# Patient Record
Sex: Male | Born: 1975 | Race: Black or African American | Hispanic: No | Marital: Single | Smoking: Current some day smoker
Health system: Southern US, Community
[De-identification: ages and names within clinical notes are randomized; demographics above are authoritative.]

## PROBLEM LIST (undated history)

## (undated) DIAGNOSIS — J302 Other seasonal allergic rhinitis: Secondary | ICD-10-CM

## (undated) DIAGNOSIS — S129XXA Fracture of neck, unspecified, initial encounter: Secondary | ICD-10-CM

## (undated) DIAGNOSIS — R011 Cardiac murmur, unspecified: Secondary | ICD-10-CM

---

## 2003-01-29 ENCOUNTER — Inpatient Hospital Stay (HOSPITAL_COMMUNITY): Admission: AC | Admit: 2003-01-29 | Discharge: 2003-02-01 | Payer: Self-pay

## 2003-02-18 ENCOUNTER — Encounter: Admission: RE | Admit: 2003-02-18 | Discharge: 2003-02-18 | Payer: Self-pay | Admitting: Neurosurgery

## 2003-03-25 ENCOUNTER — Encounter: Admission: RE | Admit: 2003-03-25 | Discharge: 2003-03-25 | Payer: Self-pay | Admitting: Neurosurgery
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
## 2012-03-02 ENCOUNTER — Emergency Department (HOSPITAL_COMMUNITY)
Admission: EM | Admit: 2012-03-02 | Discharge: 2012-03-02 | Disposition: A | Discharge status: Home / Self Care | Payer: No Typology Code available for payment source | Attending: Emergency Medicine | Admitting: Emergency Medicine

## 2012-03-02 ENCOUNTER — Encounter (HOSPITAL_COMMUNITY): Payer: Self-pay

## 2012-03-02 DIAGNOSIS — Z8679 Personal history of other diseases of the circulatory system: Secondary | ICD-10-CM | POA: Insufficient documentation

## 2012-03-02 DIAGNOSIS — Y929 Unspecified place or not applicable: Secondary | ICD-10-CM | POA: Insufficient documentation

## 2012-03-02 DIAGNOSIS — F17200 Nicotine dependence, unspecified, uncomplicated: Secondary | ICD-10-CM | POA: Insufficient documentation

## 2012-03-02 DIAGNOSIS — Y939 Activity, unspecified: Secondary | ICD-10-CM | POA: Insufficient documentation

## 2012-03-02 DIAGNOSIS — S139XXA Sprain of joints and ligaments of unspecified parts of neck, initial encounter: Secondary | ICD-10-CM | POA: Insufficient documentation

## 2012-03-02 DIAGNOSIS — S161XXA Strain of muscle, fascia and tendon at neck level, initial encounter: Secondary | ICD-10-CM

## 2012-03-02 DIAGNOSIS — M62838 Other muscle spasm: Secondary | ICD-10-CM | POA: Insufficient documentation

## 2012-03-02 DIAGNOSIS — Z8781 Personal history of (healed) traumatic fracture: Secondary | ICD-10-CM | POA: Insufficient documentation

## 2012-03-02 DIAGNOSIS — X58XXXA Exposure to other specified factors, initial encounter: Secondary | ICD-10-CM | POA: Insufficient documentation

## 2012-03-02 HISTORY — DX: Cardiac murmur, unspecified: R01.1

## 2012-03-02 HISTORY — DX: Other seasonal allergic rhinitis: J30.2

## 2012-03-02 MED ORDER — METHOCARBAMOL 500 MG PO TABS
500.0000 mg | ORAL_TABLET | Freq: Once | ORAL | Status: AC
  Administered 2012-03-02: 500 mg via ORAL
  Filled 2012-03-02: qty 1

## 2012-03-02 MED ORDER — HYDROCODONE-ACETAMINOPHEN 5-325 MG PO TABS
2.0000 | ORAL_TABLET | Freq: Once | ORAL | Status: AC
  Administered 2012-03-02: 2 via ORAL
  Filled 2012-03-02: qty 2

## 2012-03-02 MED ORDER — HYDROCODONE-ACETAMINOPHEN 5-325 MG PO TABS: 1.0000 | ORAL_TABLET | ORAL | Status: AC | PRN

## 2012-03-02 MED ORDER — IBUPROFEN 400 MG PO TABS
600.0000 mg | ORAL_TABLET | Freq: Once | ORAL | Status: AC
  Administered 2012-03-02: 600 mg via ORAL
  Filled 2012-03-02: qty 2

## 2012-03-02 MED ORDER — IBUPROFEN 600 MG PO TABS: 600.0000 mg | ORAL_TABLET | Freq: Four times a day (QID) | ORAL | Status: DC | PRN

## 2012-03-02 MED ORDER — METHOCARBAMOL 500 MG PO TABS: 500.0000 mg | ORAL_TABLET | Freq: Two times a day (BID) | ORAL | Status: AC
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
## 2012-03-02 NOTE — ED Notes (Signed)
The patient states that his neck has been hurting him since Wednesday.  He denies any trauma.

## 2014-02-06 ENCOUNTER — Emergency Department (HOSPITAL_COMMUNITY)
Admission: EM | Admit: 2014-02-06 | Discharge: 2014-02-06 | Disposition: A | Discharge status: Home / Self Care | Payer: No Typology Code available for payment source | Attending: Emergency Medicine | Admitting: Emergency Medicine

## 2014-02-06 ENCOUNTER — Encounter (HOSPITAL_COMMUNITY): Payer: Self-pay | Admitting: Emergency Medicine

## 2014-02-06 DIAGNOSIS — R0789 Other chest pain: Secondary | ICD-10-CM | POA: Diagnosis not present

## 2014-02-06 DIAGNOSIS — Z23 Encounter for immunization: Secondary | ICD-10-CM | POA: Insufficient documentation

## 2014-02-06 DIAGNOSIS — Z79899 Other long term (current) drug therapy: Secondary | ICD-10-CM | POA: Diagnosis not present

## 2014-02-06 DIAGNOSIS — M25521 Pain in right elbow: Secondary | ICD-10-CM | POA: Diagnosis not present

## 2014-02-06 DIAGNOSIS — Z720 Tobacco use: Secondary | ICD-10-CM | POA: Insufficient documentation

## 2014-02-06 DIAGNOSIS — R079 Chest pain, unspecified: Secondary | ICD-10-CM | POA: Diagnosis present

## 2014-02-06 DIAGNOSIS — Z8781 Personal history of (healed) traumatic fracture: Secondary | ICD-10-CM | POA: Diagnosis not present

## 2014-02-06 DIAGNOSIS — R011 Cardiac murmur, unspecified: Secondary | ICD-10-CM | POA: Insufficient documentation

## 2014-02-06 HISTORY — DX: Fracture of neck, unspecified, initial encounter: S12.9XXA

## 2014-02-06 LAB — I-STAT TROPONIN, ED: Troponin i, poc: 0 ng/mL (ref 0.00–0.08)

## 2014-02-06 LAB — I-STAT CHEM 8, ED
BUN: 6 mg/dL (ref 6–23)
CALCIUM ION: 1.21 mmol/L (ref 1.12–1.23)
CHLORIDE: 102 meq/L (ref 96–112)
Creatinine, Ser: 0.9 mg/dL (ref 0.50–1.35)
GLUCOSE: 92 mg/dL (ref 70–99)
HCT: 44 % (ref 39.0–52.0)
Hemoglobin: 15 g/dL (ref 13.0–17.0)
POTASSIUM: 3.9 mmol/L (ref 3.5–5.1)
SODIUM: 139 mmol/L (ref 135–145)
TCO2: 22 mmol/L (ref 0–100)

## 2014-02-06 MED ORDER — TETANUS-DIPHTH-ACELL PERTUSSIS 5-2.5-18.5 LF-MCG/0.5 IM SUSP
0.5000 mL | Freq: Once | INTRAMUSCULAR | Status: AC
  Administered 2014-02-06: 0.5 mL via INTRAMUSCULAR
  Filled 2014-02-06: qty 0.5

## 2014-02-06 MED ORDER — IBUPROFEN 800 MG PO TABS
800.0000 mg | ORAL_TABLET | Freq: Once | ORAL | Status: AC
  Administered 2014-02-06: 800 mg via ORAL
  Filled 2014-02-06: qty 1

## 2014-02-06 MED ORDER — IBUPROFEN 600 MG PO TABS: 600.0000 mg | ORAL_TABLET | Freq: Four times a day (QID) | ORAL | Status: DC | PRN

## 2014-02-06 MED ORDER — IBUPROFEN 800 MG PO TABS: 800.0000 mg | ORAL_TABLET | Freq: Three times a day (TID) | ORAL | Status: DC

## 2014-02-06 MED ORDER — CEPHALEXIN 500 MG PO CAPS: 500.0000 mg | ORAL_CAPSULE | Freq: Four times a day (QID) | ORAL | Status: AC

## 2014-02-06 MED ORDER — CEPHALEXIN 250 MG PO CAPS
500.0000 mg | ORAL_CAPSULE | Freq: Once | ORAL | Status: AC
  Administered 2014-02-06: 500 mg via ORAL
  Filled 2014-02-06: qty 2

## 2014-02-06 NOTE — Discharge Instructions (Signed)
Chest Pain (Nonspecific) Take the medication as prescribed. Call Dr. Magnus Ivan if your elbow is not improving by next week. Return if your condition worsens for any reason. Call any of the numbers on the resource guide to get a primary care physician. Ask your new primary care physician to help you to stop smoking It is often hard to give a specific diagnosis for the cause of chest pain. There is always a chance that your pain could be related to something serious, such as a heart attack or a blood clot in the lungs. You need to follow up with your health care provider for further evaluation. CAUSES   Heartburn.  Pneumonia or bronchitis.  Anxiety or stress.  Inflammation around your heart (pericarditis) or lung (pleuritis or pleurisy).  A blood clot in the lung.  A collapsed lung (pneumothorax). It can develop suddenly on its own (spontaneous pneumothorax) or from trauma to the chest.  Shingles infection (herpes zoster virus). The chest wall is composed of bones, muscles, and cartilage. Any of these can be the source of the pain.  The bones can be bruised by injury.  The muscles or cartilage can be strained by coughing or overwork.  The cartilage can be affected by inflammation and become sore (costochondritis). DIAGNOSIS  Lab tests or other studies may be needed to find the cause of your pain. Your health care provider may have you take a test called an ambulatory electrocardiogram (ECG). An ECG records your heartbeat patterns over a 24-hour period. You may also have other tests, such as:  Transthoracic echocardiogram (TTE). During echocardiography, sound waves are used to evaluate how blood flows through your heart.  Transesophageal echocardiogram (TEE).  Cardiac monitoring. This allows your health care provider to monitor your heart rate and rhythm in real time.  Holter monitor. This is a portable device that records your heartbeat and can help diagnose heart arrhythmias. It  allows your health care provider to track your heart activity for several days, if needed.  Stress tests by exercise or by giving medicine that makes the heart beat faster. TREATMENT   Treatment depends on what may be causing your chest pain. Treatment may include:  Acid blockers for heartburn.  Anti-inflammatory medicine.  Pain medicine for inflammatory conditions.  Antibiotics if an infection is present.  You may be advised to change lifestyle habits. This includes stopping smoking and avoiding alcohol, caffeine, and chocolate.  You may be advised to keep your head raised (elevated) when sleeping. This reduces the chance of acid going backward from your stomach into your esophagus. Most of the time, nonspecific chest pain will improve within 2-3 days with rest and mild pain medicine.  HOME CARE INSTRUCTIONS   If antibiotics were prescribed, take them as directed. Finish them even if you start to feel better.  For the next few days, avoid physical activities that bring on chest pain. Continue physical activities as directed.  Do not use any tobacco products, including cigarettes, chewing tobacco, or electronic cigarettes.  Avoid drinking alcohol.  Only take medicine as directed by your health care provider.  Follow your health care provider's suggestions for further testing if your chest pain does not go away.  Keep any follow-up appointments you made. If you do not go to an appointment, you could develop lasting (chronic) problems with pain. If there is any problem keeping an appointment, call to reschedule. SEEK MEDICAL CARE IF:   Your chest pain does not go away, even after treatment.  You  have a rash with blisters on your chest.  You have a fever. SEEK IMMEDIATE MEDICAL CARE IF:   You have increased chest pain or pain that spreads to your arm, neck, jaw, back, or abdomen.  You have shortness of breath.  You have an increasing cough, or you cough up blood.  You  have severe back or abdominal pain.  You feel nauseous or vomit.  You have severe weakness.  You faint.  You have chills. This is an emergency. Do not wait to see if the pain will go away. Get medical help at once. Call your local emergency services (911 in U.S.). Do not drive yourself to the hospital. MAKE SURE YOU:   Understand these instructions.  Will watch your condition.  Will get help right away if you are not doing well or get worse. Document Released: 10/19/2004 Document Revised: 01/14/2013 Document Reviewed: 08/15/2007 [facility] Patient Information 2015 [place], [place]. This information is not intended to replace advice given to you by your health care provider. Make sure you discuss any questions you have with your health care provider. Bursitis Bursitis is when the fluid-filled sac (bursa) that covers and protects a joint gets puffy and irritated. The elbow, shoulder, hip, and knee joints are most often affected. HOME CARE  Put ice on the area.  Put ice in a plastic bag.  Place a towel between your skin and the bag.  Leave the ice on for 15-20 minutes, 03-04 times a day.  Put the joint through a full range of motion 4 times a day. Rest the injured joint at other times. When you have less pain, begin slow movements and usual activities.  Only take medicine as told by your doctor.  Follow up with your doctor. Any delay in care could stop the bursitis from healing. This could cause long-term pain. GET HELP RIGHT AWAY IF:   You have more pain with treatment.  You have a temperature by mouth above 102 F (38.9 C), not controlled by medicine.  You have heat and irritation over the fluid-filled sac. MAKE SURE YOU:   Understand these instructions.  Will watch your condition.  Will get help right away if you are not doing well or get worse. Document Released: 06/29/2009 Document Revised: 04/03/2011 Document Reviewed: 03/31/2013 [facility] Patient Information 2015  [place], [place]. This information is not intended to replace advice given to you by your health care provider. Make sure you discuss any questions you have with your health care provider.  Emergency Department Resource Guide 1) Find a Doctor and Pay Out of Pocket Although you won't have to find out who is covered by your insurance plan, it is a good idea to ask around and get recommendations. You will then need to call the office and see if the doctor you have chosen will accept you as a new patient and what types of options they offer for patients who are self-pay. Some doctors offer discounts or will set up payment plans for their patients who do not have insurance, but you will need to ask so you aren't surprised when you get to your appointment.  2) Contact Your Local Health Department Not all health departments have doctors that can see patients for sick visits, but many do, so it is worth a call to see if yours does. If you don't know where your local health department is, you can check in your phone book. The CDC also has a tool to help you locate your state's health  department, and many state websites also have listings of all of their local health departments.  3) Find a Walk-in Clinic If your illness is not likely to be very severe or complicated, you may want to try a walk in clinic. These are popping up all over the country in pharmacies, drugstores, and shopping centers. They're usually staffed by nurse practitioners or physician assistants that have been trained to treat common illnesses and complaints. They're usually fairly quick and inexpensive. However, if you have serious medical issues or chronic medical problems, these are probably not your best option.  No Primary Care Doctor: - Call Health Connect at  [phone] - they can help you locate a primary care doctor that  accepts your insurance, provides certain services, etc. - Physician Referral Service- [phone]  Chronic Pain  Problems: Organization         Address  Phone   Notes  [facility]  [phone] Patients need to be referred by their primary care doctor.   Medication Assistance: Organization         Address  Phone   Notes  [facility] [address] [place] [address] [place], [place] [address] [phone] --Must be a resident of [facility] -- Must have NO insurance coverage whatsoever (no Medicaid/ Medicare, etc.) -- The pt. MUST have a primary care doctor that directs their care regularly and follows them in the community   [facility]ist  [phone]   [employer]  [phone]    Agencies that provide inexpensive medical care: Organization         Address  Phone   Notes  [facility]  [phone]   [facility]    [phone]   [facility] [address] [place], [place] [address] [phone]   [facility] [address] [place], [place] [phone]   Planned Parenthood    [phone]   [facility]    [phone]   [facility]  [address], [place] Phone:  [phone], Fax:  [phone] Hours of Operation:  9 am - 6 pm, M-F.  Also accepts Medicaid/Medicare and self-pay.  [facility]  [address], [place] Phone: [phone], Fax: [phone]. Hours of Operation:  8:30 am - 5:30 pm, M-F.  Also accepts Medicaid and self-pay.  [facility] [place] [address], [place] Phone: [phone]   [facility] [address] Natasha Bence [place], [place] [phone], Ext. [phone] Mondays & Thursdays: 7-9 AM.  First 15 patients are seen on a first come, first serve basis.    Medicaid-accepting [facility] Providers:  Organization         Address  Phone   Notes  [facility] [address], [place] [phone] Also  accepts self-pay patients.  [facility] [address] Laurell Josephs [place], [place]  [phone]   [facility] 2 [address], [place] [phone]   [facility] [address] [place] [phone]   Renaye Rakers [address], [place]   [phone] Only accepts [place] Access [place] patients after they have their name applied to their card.   Self-Pay (no insurance) in [facility]:  Organization  Address  Phone   Notes  Sickle Cell Patients, [facility] [place] [phone]   [facility] [place] [phone]   [facility] [place]  [place], [address], [place] [phone]   [facility]/Dr. Osei-Bonsu  [address], [place] or [address], [place] [phone] Phone number for both [place] and [place] locations is the same.  [facility] [address] [place] [phone]   [facility] [address] [place] or [address] [phone]   [facility] [address], [place] [phone], phone; [phone], fax Sees patients 1st and 3rd Saturday of every month.  Must not qualify for public or private insurance (i.e. Medicaid, Medicare, [employer] Health Choice, Veterans' Benefits)  Household income should be no more than 200% of the poverty level The clinic cannot treat you if you are pregnant or think you are pregnant  Sexually transmitted diseases are not treated at the clinic.    Dental Care: Organization         Address  Phone  Notes  [facility] of [facility] [place] [phone] Accepts children up to age 23 who are enrolled in [place] or [place]; pregnant  women with a Medicaid card; and children who have applied for Medicaid or [place] Health Choice, but were declined, whose parents can pay a reduced fee at time of service.  [facility] Department of [facility]  [address], [place] [phone] Accepts children up to age 45 who are enrolled in [place] or [place]; pregnant women with a Medicaid card; and children who have applied for Medicaid or [place] Health Choice, but were declined, whose parents can pay a reduced fee at time of service.  [facility]  [place] [phone] Patients are seen by appointment only. Walk-ins are not accepted. [facility] will see patients 12 years of age and older. Monday - Tuesday (8am-5pm) Most Wednesdays (8:30-5pm) $30 per visit, cash only  [facility]  [address], [facility] [phone] Patients are seen by appointment only. Walk-ins are not accepted. [place] will see patients 69 years of age and older. One Wednesday Evening (Monthly: Volunteer Based).  $30 per visit, cash only  [place]  [phone] for adults; Children under age 31, call Graduate Pediatric Dentistry at [phone]. Children aged 22-14, please call [phone] to request a pediatric application.  Dental services are provided in all areas of dental care including fillings, crowns and bridges, complete and partial dentures, implants, gum treatment, root canals, and extractions. Preventive care is also provided. Treatment is provided to both adults and children. Patients are selected via a lottery and there is often a waiting list.   [facility] [address], [place]  [phone] [email or web address]   [facility] [address] [place], [place] [phone], Ext. [phone] Second and Fourth Thursday of each month, opens at 6:30 AM; Clinic ends at 9 AM.  Patients are  seen on a first-come first-served basis, and a limited number are seen during each clinic.   [facility]  [address] [place], [place]  [place], [place] [phone]   Eligibility Requirements You must have lived in [place], [place], or [place] counties for at least the last three months.   You cannot be eligible for state or federal sponsored [employer], including [employer], [place], or [employer].   You generally cannot be eligible for healthcare insurance through your employer.    How to apply: Eligibility screenings are held every Tuesday and Wednesday afternoon from 1:00 pm until 4:00 pm. You do not need an appointment for the interview!  [facility] [address] [place], [place]   [place]  [place] Department  [place]  [phone]    Behavioral Health Resources in the Community: Intensive Outpatient Programs Organization         Address  Phone  Notes  [facility] [place]. [address] [employer], [place] [phone]   [facility] Outpatient [address], [place], [place]   ADS: Alcohol [facility] Drug Svcs [address], [place], [place]   [place] [address]  [place], [place] or [phone]   Substance Abuse Resources Organization         Address  Phone  Notes  Alcohol and Drug Services  [phone]   [place]  [phone]   [facility] [place]   Chinita Pester  [phone]   Residential & Outpatient Substance Abuse Program  [phone]   Psychological Services Organization         Address  Phone  Notes  [facility]  [place]  [phone]   [place] [address], [place] or [phone]    Mobile Crisis  Teams Organization         Address  Phone  Notes  [facility], Mobile Crisis Care Unit  [phone]   [facility]  [address] [place], [place]   [place] Levels [address] [place] [phone]    Self-Help/Support Groups Organization         Address  Phone             Notes  [place]. of [place] - variety of support groups  [place] Call for more information  Narcotics Anonymous (NA), Caring [facility] [address], [employer] [place]  2 meetings at this location   Special educational needs teacher         Address  Phone  Notes  [facility] Residential Treatment [place],    [place]  [phone]   [facility]  [address] [place] [phone], [place], [place]   [place]ia Johnson, [place] [phone] Admissions: 8am-3pm M-F  Incentives Substance [place] [address]    [place], [place] [phone]   [facility] [address] [place] [address] Jadene Pierini [place], [place]   [facility] [address],  [place], [place]   [facility] - Intensive Outpatient [address] Kristeen Mans [address], [place], [place]   [facility] ([place].) [place].,  [place], [place] or [phone]   [facility] (RTS) [address], [place], [place] Accepts Medicaid  [facility] [address]  [place] [phone] Substance Abuse/Addiction Treatment   [facility] Resources Organization  Address  Phone  Notes  [facility]  [phone](888) [phone]   Angie FavaJulie Brannon, PhD [address], [address] [place], [place]   [phone]) [phone] or [phone]) [phone]   [facility]   [address] [place], [place] [phone](336) [phone]   [facility] [address], [place], [place] [phone]) [phone]  Insurance/Medicaid/sponsorship through [facility]  [facility] Families [address]                                    [place], [place] [phone]) [phone] Therapy/tele-psych/case  [facility] [address].   [place], [place] [phone]) [phone]    Dr. Lolly MustacheArfeen  [phone]) [phone]   [facility] [place]  [employer] [facility] 1) [address], [place] 2) [address], [place] 3)  [address], [place] [phone]) [phone]) [phone]  [phone]) [phone]   [facility] Child Abuse Hotline [phone](336) [phone] or [phone](336) [phone] (After Hours)

## 2014-02-06 NOTE — ED Notes (Signed)
Pt called in main ED waiting area with no response 

## 2014-02-06 NOTE — ED Notes (Signed)
Pt reports while at work last night began to experience, dizziness and chest pain. Pt had BP taken at work at it was elevated. Pt also reports had an abscess under right arm a week ago that opened up on his own. Pt also has a lump to right elbow area.

## 2014-02-06 NOTE — ED Provider Notes (Addendum)
CSN: [id number]     Arrival date & time 02/06/14  1026 History   First MD Initiated Contact with Patient 02/06/14 1558     Chief Complaint  Patient presents with  . Hypertension  . Dizziness  . Chest Pain  . Abscess     (Consider location/radiation/quality/duration/timing/severity/associated sxs/prior Treatment) HPI Complains of chest pain left-sided anterior radiating to left scapula onset 4 days ago pain is intermittent and lasts 1 minute a time brought on by stress. Pain is nonexertional he is presently chest pain-free. He reports 5 minutes of shortness of breath last night. He has blood pressure taken at work today which was 188/135 and sent here for further evaluation. He denies any dizziness Patient also reports he had a "boil" at his right axilla earlier this week which "popped" and spontaneously drained. An presently complains of swelling and painful at right posterior elbow which is mild. He does report that he leans a lot on his elbows while at work. No injury. Nothing makes pain better or worse. No fever. No other associated symptoms. Past Medical History  Diagnosis Date  . Heart murmur   . Seasonal allergic rhinitis   . Neck fracture    History reviewed. No pertinent past surgical history. Family History  Problem Relation Age of Onset  . Hypertension Mother   . Liver disease Father   . Pancreatic cancer Father    History  Substance Use Topics  . Smoking status: Current Some Day Smoker -- 1.50 packs/day    Types: Cigars  . Smokeless tobacco: Never Used  . Alcohol Use: 0.5 oz/week    1 Not specified per week    Review of Systems  Constitutional: Negative.   HENT: Negative.   Respiratory: Positive for shortness of breath.   Cardiovascular: Positive for chest pain.  Gastrointestinal: Negative.   Musculoskeletal: Positive for arthralgias.       Right elbow pain  Skin: Negative.   Neurological: Negative.   Psychiatric/Behavioral: Negative.   All other systems  reviewed and are negative.     Allergies  Review of patient's allergies indicates no known allergies.  Home Medications   Prior to Admission medications   Medication Sig Start Date End Date Taking? Authorizing Provider  acetaminophen (TYLENOL) 500 MG tablet Take 500-1,000 mg by mouth every 6 (six) hours as needed for pain.   Yes Historical Provider, MD  cetirizine (ZYRTEC) 10 MG tablet Take 10 mg by mouth daily.   Yes Historical Provider, MD  ibuprofen (ADVIL,MOTRIN) 600 MG tablet Take 1 tablet (600 mg total) by mouth every 6 (six) hours as needed for pain. 03/02/12  Yes Derwood Kaplan, MD  HYDROcodone-acetaminophen (NORCO/VICODIN) 5-325 MG per tablet Take 1 tablet by mouth every 4 (four) hours as needed for pain. Patient not taking: Reported on 02/06/2014 03/02/12   Derwood Kaplan, MD  methocarbamol (ROBAXIN) 500 MG tablet Take 1 tablet (500 mg total) by mouth 2 (two) times daily. Patient not taking: Reported on 02/06/2014 03/02/12   Derwood Kaplan, MD   BP 112/83 mmHg  Pulse 69  Temp(Src) 98 F (36.7 C) (Oral)  Resp 14  Ht  (1.778 m)  Wt 175 lb (79.379 kg)  BMI 25.11 kg/m2  SpO2 100% Physical Exam  Constitutional: He appears well-developed and well-nourished.  HENT:  Head: Normocephalic and atraumatic.  Eyes: Conjunctivae are normal. Pupils are equal, round, and reactive to light.  Neck: Neck supple. No tracheal deviation present. No thyromegaly present.  Cardiovascular: Normal rate and regular rhythm.  No murmur heard. Pulmonary/Chest: Effort normal and breath sounds normal.  Abdominal: Soft. Bowel sounds are normal. He exhibits no distension. There is no tenderness.  Musculoskeletal: Normal range of motion. He exhibits no edema or tenderness.  Right upper extremity 1 cm open wound at axilla no drainage no fluctuance no swelling nontender. Elbow posteriorly mildly tender and swollen minimally reddened. No drainage. No fluctuance. Full range of motion without pain. Radial  pulse 2+  Neurological: He is alert. Coordination normal.  Skin: Skin is warm and dry. No rash noted.  Psychiatric: He has a normal mood and affect.  Nursing note and vitals reviewed.   ED Course  Procedures (including critical care time) Labs Review Labs Reviewed  CBC  BASIC METABOLIC PANEL  I-STAT TROPOININ, ED    Imaging Review No results found.   EKG Interpretation   Date/Time:  Friday February 06 2014 10:53:03 EST Ventricular Rate:  90 PR Interval:  174 QRS Duration: 82 QT Interval:  340 QTC Calculation: 415 R Axis:   75 Text Interpretation:  Normal sinus rhythm Normal ECG No old tracing to  compare Confirmed by Ethelda ChickJACUBOWITZ  MD, Melora Menon [phone]([id number]) on 02/06/2014 3:59:48 PM     Cardiac risk factors smoker otherwise negative  6 PM pain elbow pain has resolved after treatment with ibuprofen and Keflex Results for orders placed or performed during the hospital encounter of 02/06/14  I-stat troponin, ED (not at [facility])  Result Value Ref Range   Troponin i, poc 0.00 0.00 - 0.08 ng/mL   Comment 3          I-Stat Chem 8, ED  Result Value Ref Range   Sodium 139 135 - 145 mmol/L   Potassium 3.9 3.5 - 5.1 mmol/L   Chloride 102 96 - 112 mEq/L   BUN 6 6 - 23 mg/dL   Creatinine, Ser 6.040.90 0.50 - 1.35 mg/dL   Glucose, Bld 92 70 - 99 mg/dL   Calcium, Ion 5.401.21 9.811.12 - 1.23 mmol/L   TCO2 22 0 - 100 mmol/L   Hemoglobin 15.0 13.0 - 17.0 g/dL   HCT 19.144.0 47.839.0 - 29.552.0 %   No results found.  MDM  Heart score equals 1. Spent 5 minutes calcium patient on smoking cessation Chest pain highly atypical for acute coronary syndrome Clinically patient has olecranon bursitis. No definite infection though mildly reddened He likely had abscess at right axilla which spontaneously drained. An prescriptions ibuprofen, Keflex him a recheck referral resource guide for primary care physician. Orthopedic referral with Dr. Magnus IvanBlackman if elbow not better by next week Final diagnoses:  None   diagnosis #1  atypical chest pain #2 olecranon bursitis right elbow #3 tobacco abuse     Doug SouSam Sota Hetz, MD 02/06/14 1807  Doug SouSam Keiandra Sullenger, MD 02/06/14 1807

## 2016-03-03 ENCOUNTER — Encounter (HOSPITAL_COMMUNITY): Payer: Self-pay | Admitting: Emergency Medicine

## 2016-03-03 ENCOUNTER — Emergency Department (HOSPITAL_COMMUNITY)
Admission: EM | Admit: 2016-03-03 | Discharge: 2016-03-03 | Disposition: A | Discharge status: Home / Self Care | Payer: BLUE CROSS/BLUE SHIELD | Attending: Emergency Medicine | Admitting: Emergency Medicine

## 2016-03-03 ENCOUNTER — Emergency Department (HOSPITAL_COMMUNITY): Payer: BLUE CROSS/BLUE SHIELD

## 2016-03-03 DIAGNOSIS — Z79899 Other long term (current) drug therapy: Secondary | ICD-10-CM | POA: Diagnosis not present

## 2016-03-03 DIAGNOSIS — R0789 Other chest pain: Secondary | ICD-10-CM | POA: Diagnosis not present

## 2016-03-03 DIAGNOSIS — F17290 Nicotine dependence, other tobacco product, uncomplicated: Secondary | ICD-10-CM | POA: Diagnosis not present

## 2016-03-03 DIAGNOSIS — R079 Chest pain, unspecified: Secondary | ICD-10-CM | POA: Diagnosis present

## 2016-03-03 LAB — CBC
HCT: 37.1 % — ABNORMAL LOW (ref 39.0–52.0)
Hemoglobin: 12.2 g/dL — ABNORMAL LOW (ref 13.0–17.0)
MCH: 23.2 pg — AB (ref 26.0–34.0)
MCHC: 32.9 g/dL (ref 30.0–36.0)
MCV: 70.7 fL — AB (ref 78.0–100.0)
PLATELETS: 289 10*3/uL (ref 150–400)
RBC: 5.25 MIL/uL (ref 4.22–5.81)
RDW: 14.3 % (ref 11.5–15.5)
WBC: 8.3 10*3/uL (ref 4.0–10.5)

## 2016-03-03 LAB — BASIC METABOLIC PANEL
Anion gap: 8 (ref 5–15)
BUN: 11 mg/dL (ref 6–20)
CO2: 24 mmol/L (ref 22–32)
CREATININE: 1 mg/dL (ref 0.61–1.24)
Calcium: 10.1 mg/dL (ref 8.9–10.3)
Chloride: 105 mmol/L (ref 101–111)
GFR calc Af Amer: >60 mL/min (ref >60)
GFR calc non Af Amer: >60 mL/min (ref >60)
Glucose, Bld: 102 mg/dL — ABNORMAL HIGH (ref 65–99)
Potassium: 4.3 mmol/L (ref 3.5–5.1)
Sodium: 137 mmol/L (ref 135–145)

## 2016-03-03 LAB — I-STAT TROPONIN, ED: Troponin i, poc: 0 ng/mL (ref 0.00–0.08)

## 2016-03-03 NOTE — ED Provider Notes (Signed)
[facility]-EMERGENCY DEPT Provider Note   CSN: [id number] Arrival date & time: 03/03/16  0203  By signing my name below, I, Jack Johnson, attest that this documentation has been prepared under the direction and in the presence of Jack Lyons, MD.  Electronically Signed: Octavia Johnson, ED Scribe. 03/03/16. 3:45 AM.    History   Chief Complaint Chief Complaint  Patient presents with  . Chest Pain   The history is provided by the patient. No language interpreter was used.  Chest Pain   This is a new problem. The current episode started 3 to 5 hours ago. The problem occurs rarely. The problem has not changed since onset.The pain is present in the lateral region. The pain is moderate. The quality of the pain is described as sharp. Associated symptoms include shortness of breath. Pertinent negatives include no back pain, no cough, no fever, no headaches, no leg pain, no lower extremity edema, no numbness, no syncope and no weakness. He has tried nothing for the symptoms. The treatment provided no relief. There are no known risk factors.  Pertinent negatives for past medical history include no aneurysm, no CAD, no COPD, no CHF, no DVT, no hyperhomocysteinemia, no hypertension and no MI.   HPI Comments: Jack Johnson is a 41 y.o. male who presents to the Emergency Department complaining of intermittent, gradual worsening, left sided chest pain that radiates to his LUQ that began this evening. Pt describes his pain as a persistent, sharp sensation. He reports associated shortness of breath. He reports he was laying down after dinner this evening when began to feel the sharp pain in his chest that progressively became worse and he "started to shake". No medication has been taken to alleviate his pain. There has been no injury or trauma to the area. He has not had similar pain in the past. Pt denies fever, cough, leg swelling, or leg pain.  Past Medical History:  Diagnosis Date  . Heart murmur   . Neck  fracture ([facility])   . Seasonal allergic rhinitis     There are no active problems to display for this patient.   History reviewed. No pertinent surgical history.     Home Medications    Prior to Admission medications   Medication Sig Start Date End Date Taking? Authorizing Provider  acetaminophen (TYLENOL) 500 MG tablet Take 500-1,000 mg by mouth every 6 (six) hours as needed for pain.    Historical Provider, MD  cephALEXin (KEFLEX) 500 MG capsule Take 1 capsule (500 mg total) by mouth 4 (four) times daily. 02/06/14   Doug Sou, MD  cetirizine (ZYRTEC) 10 MG tablet Take 10 mg by mouth daily.    Historical Provider, MD  HYDROcodone-acetaminophen (NORCO/VICODIN) 5-325 MG per tablet Take 1 tablet by mouth every 4 (four) hours as needed for pain. Patient not taking: Reported on 02/06/2014 03/02/12   Derwood Kaplan, MD  ibuprofen (ADVIL,MOTRIN) 600 MG tablet Take 1 tablet (600 mg total) by mouth every 6 (six) hours as needed. 02/06/14   Doug Sou, MD  ibuprofen (ADVIL,MOTRIN) 800 MG tablet Take 1 tablet (800 mg total) by mouth 3 (three) times daily. 02/06/14   Doug Sou, MD  methocarbamol (ROBAXIN) 500 MG tablet Take 1 tablet (500 mg total) by mouth 2 (two) times daily. Patient not taking: Reported on 02/06/2014 03/02/12   Derwood Kaplan, MD    Family History Family History  Problem Relation Age of Onset  . Hypertension Mother   . Liver disease Father   .  Pancreatic cancer Father     Social History Social History  Substance Use Topics  . Smoking status: Current Some Day Smoker    Packs/day: 1.50    Types: Cigars  . Smokeless tobacco: Never Used  . Alcohol use 0.5 oz/week    1 Standard drinks or equivalent per week     Allergies   Patient has no known allergies.   Review of Systems Review of Systems  Constitutional: Negative for fever.  Respiratory: Positive for shortness of breath. Negative for cough.   Cardiovascular: Positive for chest pain. Negative for  syncope.  Musculoskeletal: Negative for back pain.  Neurological: Negative for weakness, numbness and headaches.  All other systems reviewed and are negative.    Physical Exam Updated Vital Signs BP 99/72   Pulse 71   Temp 98.3 F (36.8 C) (Oral)   Resp 17   SpO2 97%   Physical Exam  Constitutional: He is oriented to person, place, and time. He appears well-developed and well-nourished.  HENT:  Head: Normocephalic and atraumatic.  Eyes: EOM are normal.  Neck: Normal range of motion.  Cardiovascular: Normal rate, regular rhythm, normal heart sounds and intact distal pulses.   Pulmonary/Chest: Effort normal and breath sounds normal. No respiratory distress.  Abdominal: Soft. He exhibits no distension. There is no tenderness.  Musculoskeletal: Normal range of motion.  Neurological: He is alert and oriented to person, place, and time.  Skin: Skin is warm and dry.  Psychiatric: He has a normal mood and affect. Judgment normal.  Nursing note and vitals reviewed.    ED Treatments / Results  DIAGNOSTIC STUDIES: Oxygen Saturation is 97% on RA, normal by my interpretation.  COORDINATION OF CARE:  3:43 AM Discussed treatment plan with pt at bedside and pt agreed to plan.  Labs (all labs ordered are listed, but only abnormal results are displayed) Labs Reviewed  BASIC METABOLIC PANEL - Abnormal; Notable for the following:       Result Value   Glucose, Bld 102 (*)    All other components within normal limits  CBC - Abnormal; Notable for the following:    Hemoglobin 12.2 (*)    HCT 37.1 (*)    MCV 70.7 (*)    MCH 23.2 (*)    All other components within normal limits  I-STAT TROPOININ, ED    EKG  EKG Interpretation None       Radiology Dg Chest 2 View  Result Date: 03/03/2016 CLINICAL DATA:  Chest pain with tightening in the jaw and chest. EXAM: CHEST  2 VIEW COMPARISON:  None. FINDINGS: The heart size and mediastinal contours are within normal limits. Both lungs are  clear. The visualized skeletal structures are unremarkable. IMPRESSION: No active cardiopulmonary disease. Electronically Signed   By: Burman NievesWilliam  Stevens M.D.   On: 03/03/2016 02:34    Procedures Procedures (including critical care time)  Medications Ordered in ED Medications - No data to display   Initial Impression / Assessment and Plan / ED Course  I have reviewed the triage vital signs and the nursing notes.  Pertinent labs & imaging results that were available during my care of the patient were reviewed by me and considered in my medical decision making (see chart for details).     Patient is a 41 year old male who presents with complaints of pain in the left chest wall. He has no cardiac risk factors and his symptoms are very atypical for cardiac disease. Workup reveals no evidence for ischemia on EKG  or laboratory studies. I believe he is appropriate for discharge. To return as needed for any problems.  Final Clinical Impressions(s) / ED Diagnoses   Final diagnoses:  None   I personally performed the services described in this documentation, which was scribed in my presence. The recorded information has been reviewed and is accurate.      New Prescriptions New Prescriptions   No medications on file     Jack Lyons, MD 03/03/16 [phone]

## 2016-03-03 NOTE — ED Triage Notes (Signed)
Patient reports left chest pain with mild SOB onset yesterday , denies nausea or diaphoresis .

## 2016-03-03 NOTE — ED Notes (Signed)
ED Provider at bedside. 

## 2016-03-03 NOTE — Discharge Instructions (Signed)
Ibuprofen 600 mg every 6 hours as needed for pain.  Return to the emergency department if you develop worsening pain, high fever, productive cough, difficulty breathing, or other new and concerning symptoms.

## 2018-01-03 IMAGING — DX DG CHEST 2V
2 series · 2 of 2 positions shown · non-contrast
Comparison: None.

CLINICAL DATA: Chest pain with tightening in the jaw and chest.

EXAM:
CHEST  2 VIEW

[chest pa]
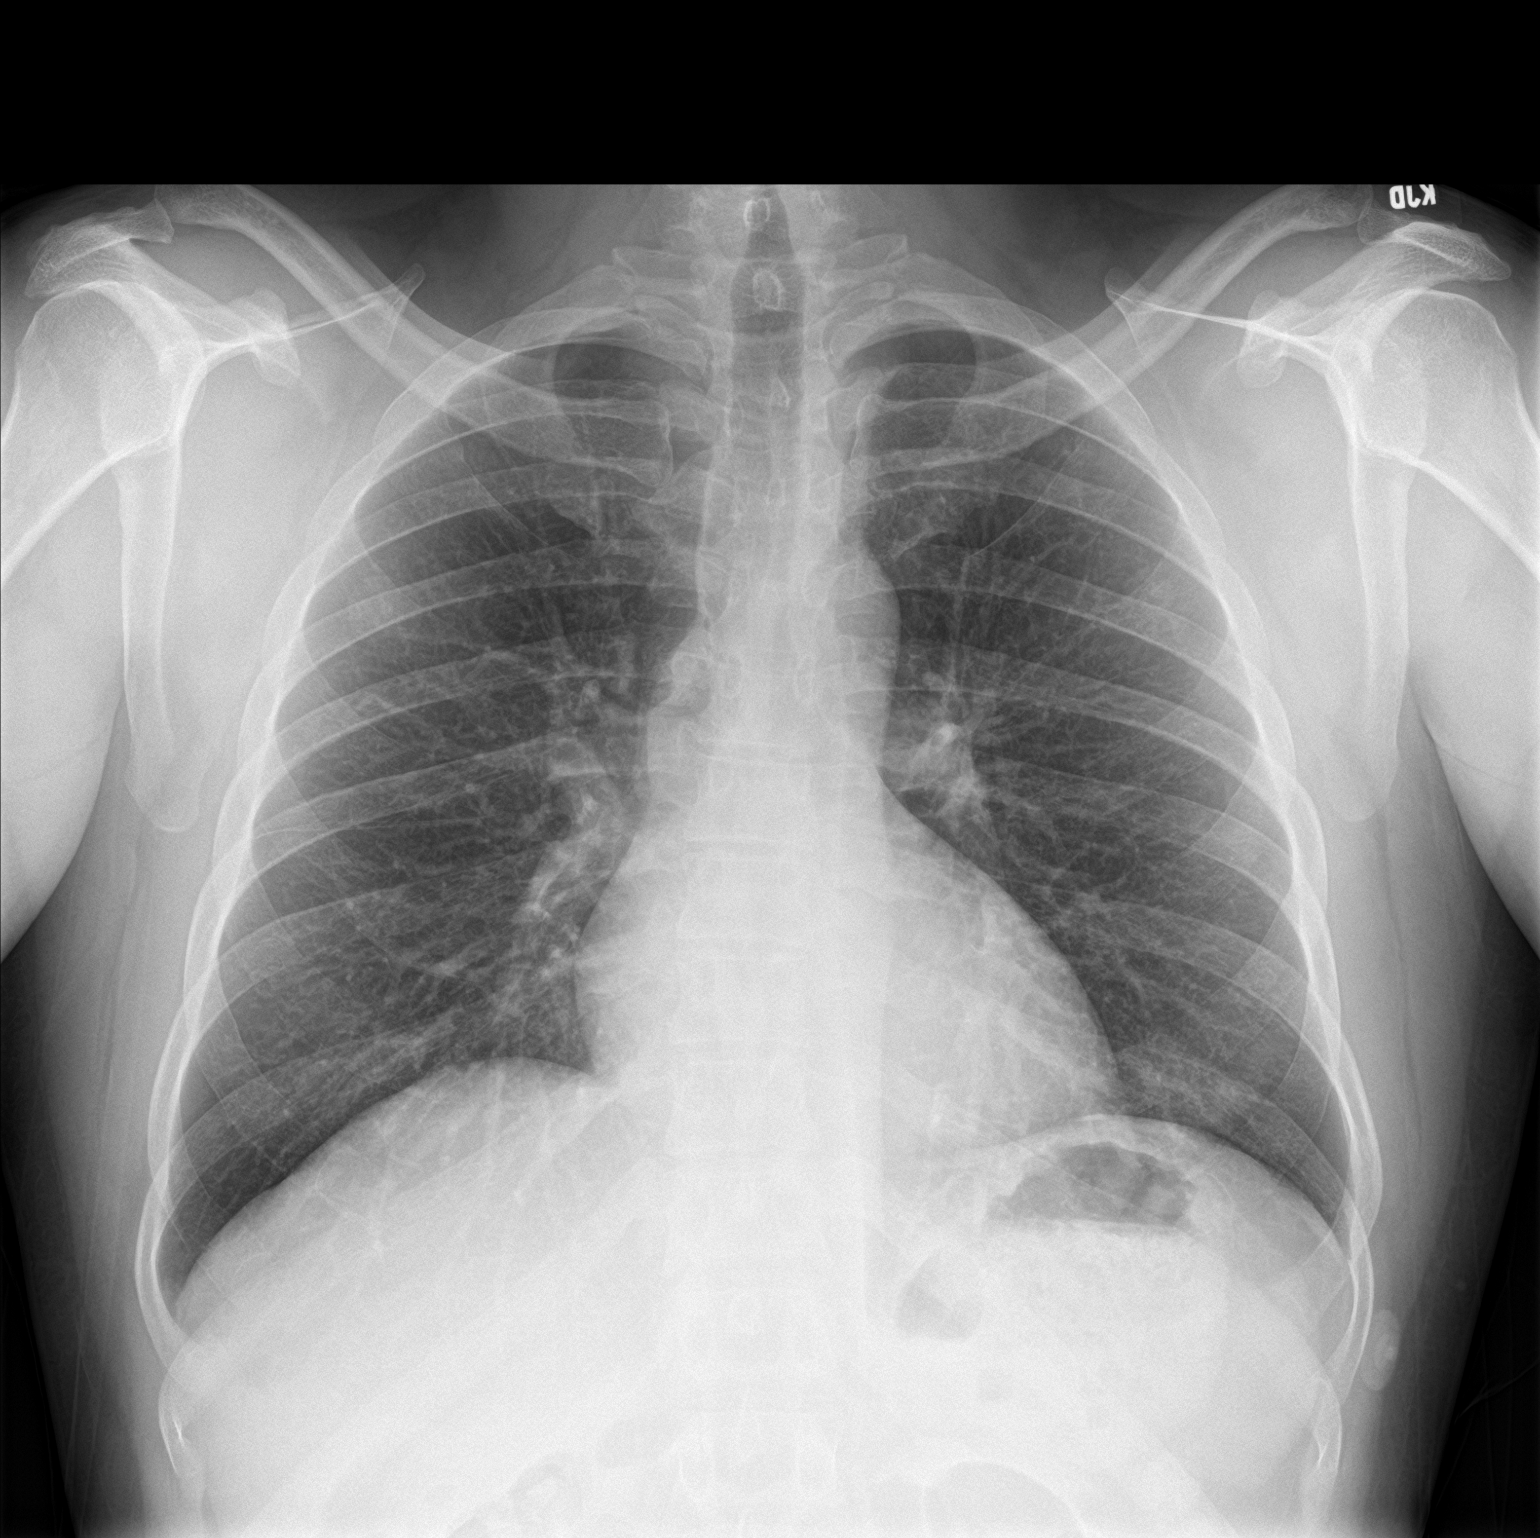

[chest lat]
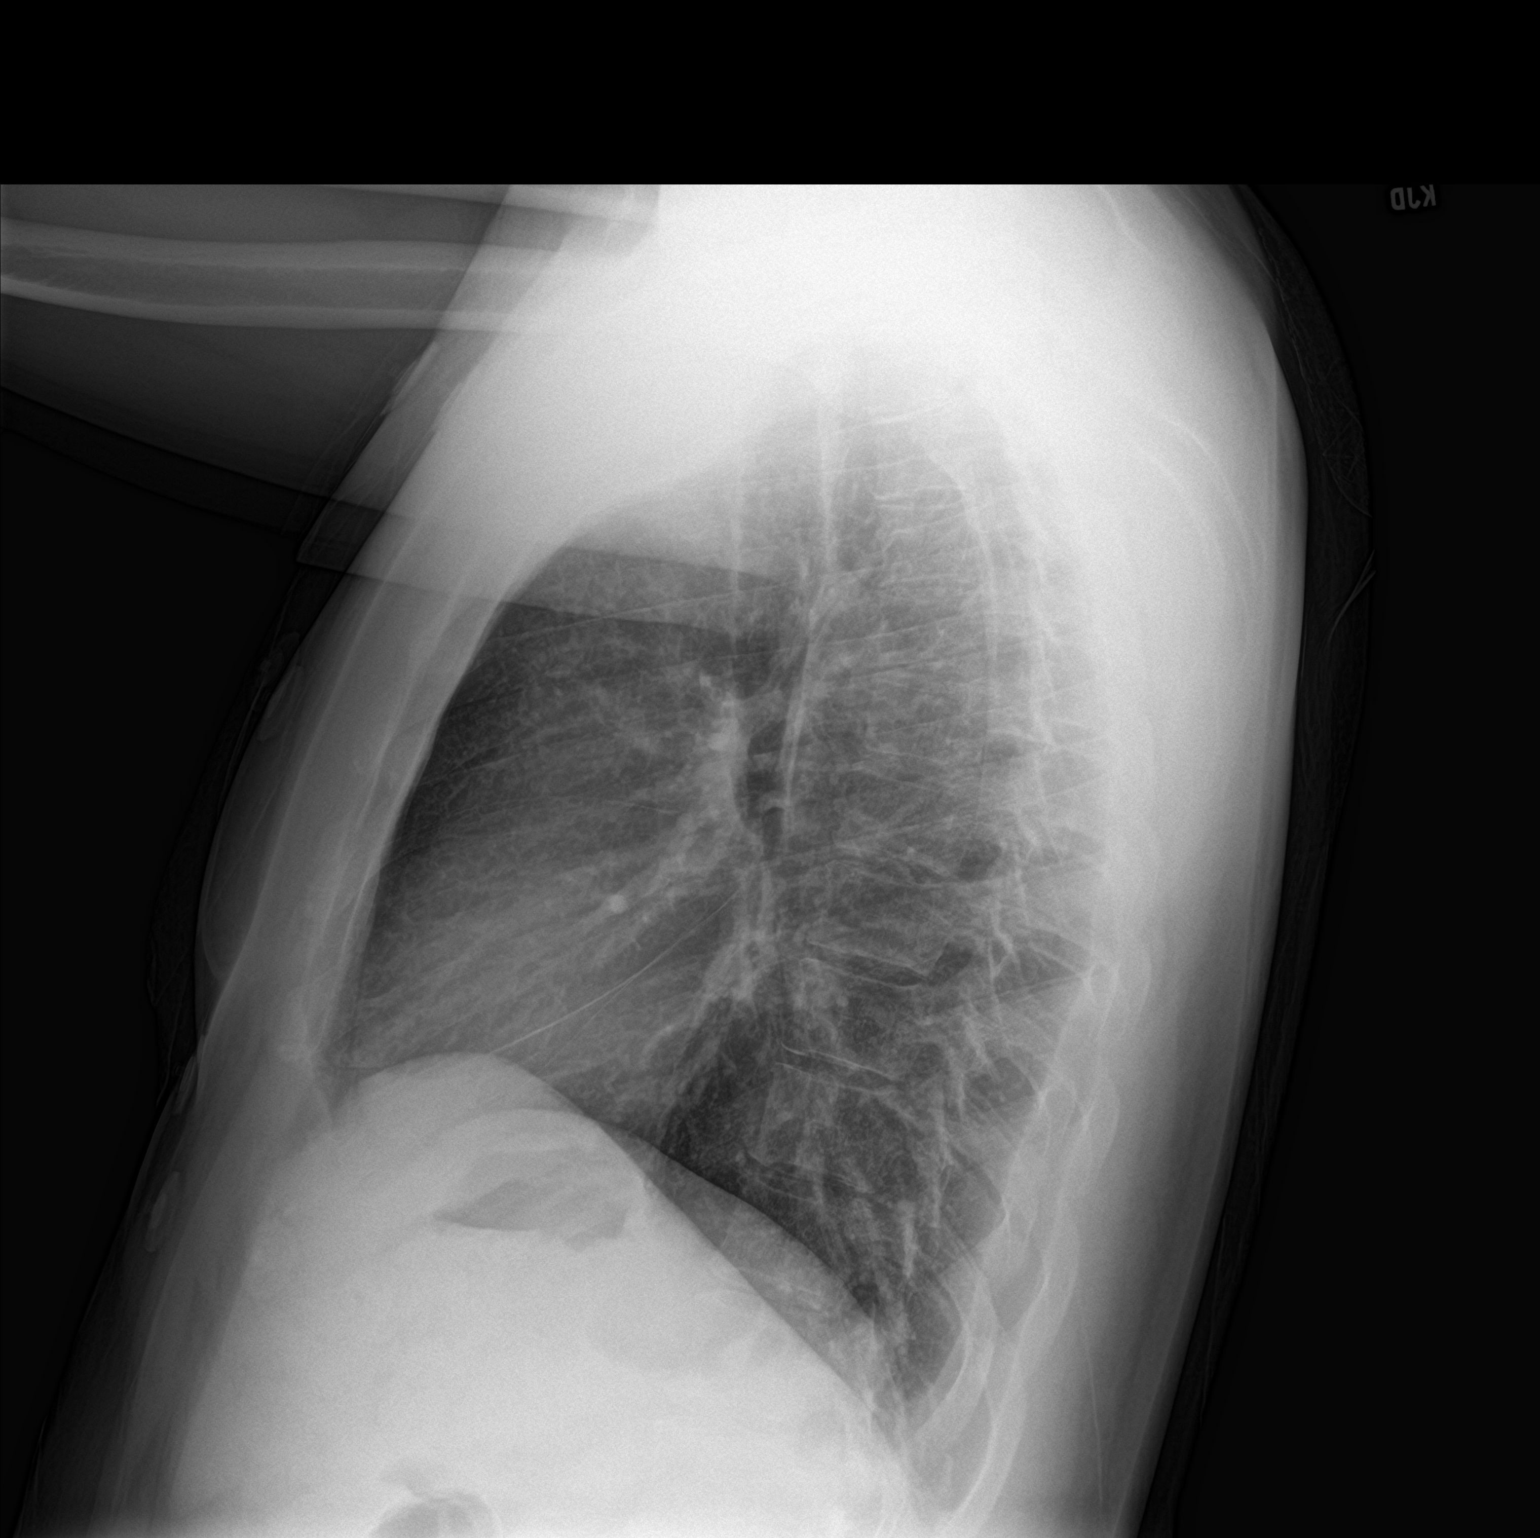

[2 of 2 positions shown; findings below may reference images not displayed]

FINDINGS: The heart size and mediastinal contours are within normal limits.
Both lungs are clear. The visualized skeletal structures are
unremarkable.
IMPRESSION: No active cardiopulmonary disease.

## 2018-10-17 ENCOUNTER — Emergency Department (HOSPITAL_COMMUNITY)
Admission: EM | Admit: 2018-10-17 | Discharge: 2018-10-17 | Disposition: A | Discharge status: Home / Self Care | Payer: BC Managed Care – PPO | Attending: Emergency Medicine | Admitting: Emergency Medicine

## 2018-10-17 ENCOUNTER — Other Ambulatory Visit: Payer: Self-pay

## 2018-10-17 ENCOUNTER — Emergency Department (HOSPITAL_COMMUNITY): Payer: BC Managed Care – PPO

## 2018-10-17 ENCOUNTER — Encounter (HOSPITAL_COMMUNITY): Payer: Self-pay | Admitting: Emergency Medicine

## 2018-10-17 DIAGNOSIS — R1033 Periumbilical pain: Secondary | ICD-10-CM

## 2018-10-17 DIAGNOSIS — R112 Nausea with vomiting, unspecified: Secondary | ICD-10-CM | POA: Diagnosis not present

## 2018-10-17 DIAGNOSIS — F17210 Nicotine dependence, cigarettes, uncomplicated: Secondary | ICD-10-CM | POA: Diagnosis not present

## 2018-10-17 LAB — CBC
HCT: 44.2 % (ref 39.0–52.0)
Hemoglobin: 14.5 g/dL (ref 13.0–17.0)
MCH: 23.8 pg — ABNORMAL LOW (ref 26.0–34.0)
MCHC: 32.8 g/dL (ref 30.0–36.0)
MCV: 72.5 fL — ABNORMAL LOW (ref 80.0–100.0)
Platelets: 391 10*3/uL (ref 150–400)
RBC: 6.1 MIL/uL — ABNORMAL HIGH (ref 4.22–5.81)
RDW: 14.9 % (ref 11.5–15.5)
WBC: 10.2 10*3/uL (ref 4.0–10.5)
nRBC: 0 % (ref 0.0–0.2)

## 2018-10-17 LAB — COMPREHENSIVE METABOLIC PANEL
ALT: 21 U/L (ref 0–44)
AST: 17 U/L (ref 15–41)
Albumin: 4.1 g/dL (ref 3.5–5.0)
Alkaline Phosphatase: 81 U/L (ref 38–126)
Anion gap: 10 (ref 5–15)
BUN: 9 mg/dL (ref 6–20)
CO2: 24 mmol/L (ref 22–32)
Calcium: 9.8 mg/dL (ref 8.9–10.3)
Chloride: 103 mmol/L (ref 98–111)
Creatinine, Ser: 0.99 mg/dL (ref 0.61–1.24)
GFR calc Af Amer: >60 mL/min (ref >60)
GFR calc non Af Amer: >60 mL/min (ref >60)
Glucose, Bld: 126 mg/dL — ABNORMAL HIGH (ref 70–99)
Potassium: 3.7 mmol/L (ref 3.5–5.1)
Sodium: 137 mmol/L (ref 135–145)
Total Bilirubin: 0.3 mg/dL (ref 0.3–1.2)
Total Protein: 7.1 g/dL (ref 6.5–8.1)

## 2018-10-17 LAB — URINALYSIS, ROUTINE W REFLEX MICROSCOPIC
Bilirubin Urine: NEGATIVE
Glucose, UA: NEGATIVE mg/dL
Hgb urine dipstick: NEGATIVE
Ketones, ur: NEGATIVE mg/dL
Leukocytes,Ua: NEGATIVE
Nitrite: NEGATIVE
Protein, ur: NEGATIVE mg/dL
Specific Gravity, Urine: 1.018 (ref 1.005–1.030)
pH: 6 (ref 5.0–8.0)

## 2018-10-17 LAB — LIPASE, BLOOD: Lipase: 23 U/L (ref 11–51)

## 2018-10-17 MED ORDER — SODIUM CHLORIDE 0.9% FLUSH: 3.0000 mL | Freq: Once | INTRAVENOUS | Status: DC

## 2018-10-17 MED ORDER — ONDANSETRON 4 MG PO TBDP: 4.0000 mg | ORAL_TABLET | Freq: Three times a day (TID) | ORAL | 0 refills | Status: AC | PRN

## 2018-10-17 MED ORDER — ACETAMINOPHEN 325 MG PO TABS
650.0000 mg | ORAL_TABLET | Freq: Once | ORAL | Status: AC
  Administered 2018-10-17: 18:00:00 650 mg via ORAL
  Filled 2018-10-17: qty 2

## 2018-10-17 MED ORDER — IOHEXOL 300 MG/ML  SOLN
100.0000 mL | Freq: Once | INTRAMUSCULAR | Status: AC | PRN
  Administered 2018-10-17: 100 mL via INTRAVENOUS

## 2018-10-17 MED ORDER — SODIUM CHLORIDE 0.9 % IV BOLUS
1000.0000 mL | Freq: Once | INTRAVENOUS | Status: AC
  Administered 2018-10-17: 18:00:00 1000 mL via INTRAVENOUS

## 2018-10-17 NOTE — ED Notes (Signed)
Patient verbalizes understanding of discharge instructions. Opportunity for questioning and answers were provided. Armband removed by staff, pt discharged from ED ambulatory.   

## 2018-10-17 NOTE — ED Triage Notes (Signed)
Pt states for 2 days he has rlq pain with right flank pain. Pt also states he has pain in rectum with no bleeding - pt states he felt constipation so took at laxative and did have a bm yesterday.

## 2018-10-17 NOTE — Discharge Instructions (Addendum)
You have been diagnosed today with abdominal pain nausea and vomiting.  At this time there does not appear to be the presence of an emergent medical condition, however there is always the potential for conditions to change. Please read and follow the below instructions.  Please return to the Emergency Department immediately for any new or worsening symptoms or if your symptoms do not improve within 2 days. Please be sure to follow up with your Primary Care Provider within one week regarding your visit today; please call their office to schedule an appointment even if you are feeling better for a follow-up visit. You may use the nausea medication Zofran as prescribed to help with nausea and vomiting.  Please be sure to drink plenty water and get plenty of rest to help with your symptoms.  Get help right away if: Your pain does not go away as soon as your doctor says it should. You have fever or chills You have chest pain or trouble breathing You cannot stop throwing up. Your pain is only in areas of your belly, such as the right side or the left lower part of the belly. You have bloody or black poop, or poop that looks like tar. You have very bad pain, cramping, or bloating in your belly. You have signs of not having enough fluid or water in your body (dehydration), such as: Dark pee, very little pee, or no pee. Cracked lips. Dry mouth. Sunken eyes. Sleepiness. Weakness. You have any new/concerning or worsening symptoms  Please read the additional information packets attached to your discharge summary.  Do not take your medicine if  develop an itchy rash, swelling in your mouth or lips, or difficulty breathing; call 911 and seek immediate emergency medical attention if this occurs.  Note: Portions of this text may have been transcribed using voice recognition software. Every effort was made to ensure accuracy; however, inadvertent computerized transcription errors may still be present.

## 2018-10-17 NOTE — ED Provider Notes (Signed)
[facility] EMERGENCY DEPARTMENT Provider Note   CSN: [id number] Arrival date & time: 10/17/18  1235     History   Chief Complaint Chief Complaint  Patient presents with  . Flank Pain    HPI Jack Johnson is a 43 y.o. male who presents to the ED today complaining of gradual onset, constant, achy, periumbilical abdominal pain x 5 days. Pt also complains of nausea, NBNB emesis, and constipation. He has been taking Miralax and OTC laxative with relief of his constipation. Pt states he has also been taking Ibuprofen and Tylenol with mild relief of his abdominal pain although it is worsened after eating. He states the only times he has vomited are after eating, prompting him to avoid food for the past couple of days. No suspicious food intake. No recent abx use. Pt denies alcohol use. Denies fever, chills, coffee ground emesis, hematemesis, melena, hematochezia, urinary sx, or any other associated symptoms. No previous PSHx on abdomen.       Past Medical History:  Diagnosis Date  . Heart murmur   . Neck fracture ([place])   . Seasonal allergic rhinitis     There are no active problems to display for this patient.   History reviewed. No pertinent surgical history.      Home Medications    Prior to Admission medications   Medication Sig Start Date End Date Taking? Authorizing Provider  acetaminophen (TYLENOL) 500 MG tablet Take 500-1,000 mg by mouth every 6 (six) hours as needed for pain.    [provider]  cephALEXin (KEFLEX) 500 MG capsule Take 1 capsule (500 mg total) by mouth 4 (four) times daily. 02/06/14   Orlie Dakin, MD  cetirizine (ZYRTEC) 10 MG tablet Take 10 mg by mouth daily.    [provider]  HYDROcodone-acetaminophen (NORCO/VICODIN) 5-325 MG per tablet Take 1 tablet by mouth every 4 (four) hours as needed for pain. Patient not taking: Reported on 02/06/2014 03/02/12   Varney Biles, MD  ibuprofen (ADVIL,MOTRIN) 600 MG tablet Take  1 tablet (600 mg total) by mouth every 6 (six) hours as needed. 02/06/14   Orlie Dakin, MD  ibuprofen (ADVIL,MOTRIN) 800 MG tablet Take 1 tablet (800 mg total) by mouth 3 (three) times daily. 02/06/14   Orlie Dakin, MD  methocarbamol (ROBAXIN) 500 MG tablet Take 1 tablet (500 mg total) by mouth 2 (two) times daily. Patient not taking: Reported on 02/06/2014 03/02/12   Varney Biles, MD    Family History Family History  Problem Relation Age of Onset  . Hypertension Mother   . Liver disease Father   . Pancreatic cancer Father     Social History Social History   Tobacco Use  . Smoking status: Current Some Day Smoker    Packs/day: 1.50    Types: Cigars  . Smokeless tobacco: Never Used  Substance Use Topics  . Alcohol use: Yes    Alcohol/week: 1.0 standard drinks    Types: 1 Standard drinks or equivalent per week  . Drug use: No     Allergies   Patient has no known allergies.   Review of Systems Review of Systems  Constitutional: Negative for chills and fever.  HENT: Negative for congestion.   Eyes: Negative for visual disturbance.  Respiratory: Negative for cough and shortness of breath.   Cardiovascular: Negative for chest pain.  Gastrointestinal: Positive for abdominal pain, constipation, nausea and vomiting. Negative for blood in stool and diarrhea.  Genitourinary: Negative for difficulty urinating, dysuria, flank  pain and frequency.  Musculoskeletal: Negative for myalgias.  Skin: Negative for rash.  Neurological: Negative for headaches.     Physical Exam Updated Vital Signs BP 140/87   Pulse 73   Temp 98.5 F (36.9 C) (Oral)   Resp 15   SpO2 99%   Physical Exam Vitals signs and nursing note reviewed.  Constitutional:      Appearance: He is not ill-appearing.  HENT:     Head: Normocephalic and atraumatic.  Eyes:     Conjunctiva/sclera: Conjunctivae normal.  Neck:     Musculoskeletal: Neck supple.  Cardiovascular:     Rate and Rhythm: Normal  rate and regular rhythm.  Pulmonary:     Effort: Pulmonary effort is normal.     Breath sounds: Normal breath sounds.  Abdominal:     Palpations: Abdomen is soft.     Tenderness: There is abdominal tenderness. There is no right CVA tenderness, left CVA tenderness, guarding or rebound.     Comments: Soft, TTP to periumbilical region and LLQ,  +BS throughout, no r/g/r, neg murphy's, neg mcburney's, no CVA TTP   Skin:    General: Skin is warm and dry.  Neurological:     Mental Status: He is alert.      ED Treatments / Results  Labs (all labs ordered are listed, but only abnormal results are displayed) Labs Reviewed  COMPREHENSIVE METABOLIC PANEL - Abnormal; Notable for the following components:      Result Value   Glucose, Bld 126 (*)    All other components within normal limits  CBC - Abnormal; Notable for the following components:   RBC 6.10 (*)    MCV 72.5 (*)    MCH 23.8 (*)    All other components within normal limits  LIPASE, BLOOD  URINALYSIS, ROUTINE W REFLEX MICROSCOPIC    EKG None  Radiology No results found.  Procedures Procedures (including critical care time)  Medications Ordered in ED Medications  sodium chloride flush (NS) 0.9 % injection 3 mL (has no administration in time range)  sodium chloride 0.9 % bolus 1,000 mL (1,000 mLs Intravenous New Bag/Given 10/17/18 1824)  acetaminophen (TYLENOL) tablet 650 mg (650 mg Oral Given 10/17/18 1827)     Initial Impression / Assessment and Plan / ED Course  I have reviewed the triage vital signs and the nursing notes.  Pertinent labs & imaging results that were available during my care of the patient were reviewed by me and considered in my medical decision making (see chart for details).  43 year old male who presents to the ED today complaining of periumbilical pain, nausea, nonbloody nonbilious emesis, constipation for the last 5 days.  Has been taking over-the-counter stool softeners with relief of his  constipation.  Still passing gas.  No previous surgeries to abdomen.  Does have some tenderness to his periumbilical region.  No CVA tenderness despite initial chief complaint of flank pain.  No peritoneal signs and no McBurney's point.  She has attempted narcotic pain medication and is requesting Tylenol instead.  Will oblige give IV fluids for symptomatic relief.  Denies any nausea currently.   Blood work obtained prior to being seen -no leukocytosis.  No electrolyte abnormalities.  Creatinine within normal limits.  No elevation in LFTs.  Lipase negative.  Urinalysis without infection or blood.   7:09 PM At shift change case signed out to Harlene SaltsBrandon Morelli, ZillahPA-C, who will dispo patient accordingly after CT A/P. If negative pt can be discharged home with PCP follow  up.   Clinical Course as of Oct 16 1908  Thu Oct 17, 2018  0539 F/u CT   [BM]    Clinical Course User Index [BM] Bill Salinas, PA-C         Final Clinical Impressions(s) / ED Diagnoses   Final diagnoses:  Periumbilical abdominal pain  Non-intractable vomiting with nausea, unspecified vomiting type    ED Discharge Orders    None       Tanda Rockers, Cordelia Poche 10/17/18 1910    Mesner, Barbara Cower, MD 10/17/18 1950

## 2018-10-17 NOTE — ED Provider Notes (Signed)
Care handoff received from [facility] PA-C at shift change.  Please see her note for full details.  In short 43 year old male presents today with abdominal pain x5 days with emesis and constipation.  Blood work overall reassuring, plan of care is to follow-up on CT abdomen pelvis anticipate discharge.  Urinalysis within normal limits Lipase within normal limits CMP nonacute CBC nonacute Physical Exam  BP 135/90 (BP Location: Right Arm)   Pulse 65   Temp 98.5 F (36.9 C) (Oral)   Resp 16   SpO2 99%   Physical Exam Constitutional:      General: He is not in acute distress.    Appearance: Normal appearance. He is well-developed. He is obese. He is not ill-appearing or diaphoretic.  HENT:     Head: Normocephalic and atraumatic.     Right Ear: External ear normal.     Left Ear: External ear normal.     Nose: Nose normal.  Eyes:     General: Vision grossly intact. Gaze aligned appropriately.     Pupils: Pupils are equal, round, and reactive to light.  Neck:     Musculoskeletal: Normal range of motion.     Trachea: Trachea and phonation normal. No tracheal deviation.  Pulmonary:     Effort: Pulmonary effort is normal. No respiratory distress.  Abdominal:     General: There is no distension.     Palpations: Abdomen is soft.     Tenderness: There is no abdominal tenderness. There is no guarding or rebound. Negative signs include Murphy's sign.  Genitourinary:    Comments: GU examination deferred by patient Musculoskeletal: Normal range of motion.  Skin:    General: Skin is warm and dry.  Neurological:     Mental Status: He is alert.     GCS: GCS eye subscore is 4. GCS verbal subscore is 5. GCS motor subscore is 6.     Comments: Speech is clear and goal oriented, follows commands Major Cranial nerves without deficit, no facial droop Moves extremities without ataxia, coordination intact  Psychiatric:        Behavior: Behavior normal.     ED Course/Procedures       Procedures  MDM  CT AP:  IMPRESSION:  No acute abnormality or explanation for abdominal pain.    Aortic Atherosclerosis (ICD10-I70.0) (mild).  - Patient reassessed sleeping easily arousable.  Reports improvement of pain today and is requesting discharge.  He is agreeable to discharge with Zofran and PCP follow-up.  Encouraged increasing his water to avoid dehydration and may continue MiraLAX with constipation.  On assessment of the abdomen is soft nontender without peritoneal signs.  At this time there does not appear to be any evidence of an acute emergency medical condition and the patient appears stable for discharge with appropriate outpatient follow up. Diagnosis was discussed with patient who verbalizes understanding of care plan and is agreeable to discharge. I have discussed return precautions with patient who verbalizes understanding of return precautions. Patient encouraged to follow-up with their PCP. All questions answered.  Patient has been discharged in good condition.   Note: Portions of this report may have been transcribed using voice recognition software. Every effort was made to ensure accuracy; however, inadvertent computerized transcription errors may still be present.   Gari Crown 10/17/18 2213    Lucrezia Starch, MD 10/19/18 [phone]

## 2018-10-30 ENCOUNTER — Inpatient Hospital Stay: Payer: BC Managed Care – PPO

## 2018-11-11 ENCOUNTER — Ambulatory Visit: Payer: BC Managed Care – PPO | Attending: Family Medicine | Admitting: Family Medicine

## 2018-11-11 ENCOUNTER — Encounter: Payer: Self-pay | Admitting: Family Medicine

## 2018-11-11 ENCOUNTER — Other Ambulatory Visit: Payer: Self-pay

## 2018-11-11 VITALS — BP 137/82 | HR 66 | Temp 98.0°F | Ht 71.0 in | Wt 199.0 lb

## 2018-11-11 DIAGNOSIS — Z720 Tobacco use: Secondary | ICD-10-CM

## 2018-11-11 DIAGNOSIS — R1084 Generalized abdominal pain: Secondary | ICD-10-CM | POA: Diagnosis not present

## 2018-11-11 DIAGNOSIS — F17291 Nicotine dependence, other tobacco product, in remission: Secondary | ICD-10-CM

## 2018-11-11 DIAGNOSIS — M545 Low back pain: Secondary | ICD-10-CM

## 2018-11-11 DIAGNOSIS — G8929 Other chronic pain: Secondary | ICD-10-CM

## 2018-11-11 DIAGNOSIS — K5909 Other constipation: Secondary | ICD-10-CM | POA: Diagnosis not present

## 2018-11-11 MED ORDER — IBUPROFEN 600 MG PO TABS: 600.0000 mg | ORAL_TABLET | Freq: Three times a day (TID) | ORAL | 1 refills | Status: DC | PRN

## 2018-11-11 MED ORDER — IBUPROFEN 600 MG PO TABS: 600.0000 mg | ORAL_TABLET | Freq: Three times a day (TID) | ORAL | 1 refills | Status: AC | PRN

## 2018-11-11 MED ORDER — OMEPRAZOLE 20 MG PO CPDR: 20.0000 mg | DELAYED_RELEASE_CAPSULE | Freq: Every day | ORAL | 3 refills | Status: AC

## 2018-11-11 NOTE — Progress Notes (Signed)
Subjective:  Patient ID: MANFORD SPRONG, male    DOB: 04-Mar-1975  Age: 43 y.o. MRN: [id number]  CC: Hospitalization Follow-up   HPI Jack Johnson is a 43 year old male who presents to establish care today.  He had an ED visit 3 weeks ago for abdominal pain, nausea and vomiting with associated constipation. Labs have been reviewed and were unremarkable. CT abdomen and pelvis revealed no acute abnormalities to explain abdominal pain. He was treated with IV fluids and Tylenol and subsequently discharged.  Today he informs me abdominal pain occurs after he eats too much and is located in the epigastric region with associated low back pain.  He endorses abdominal bloating, constipation.  Constipation is relieved mildly by use of laxative but this does not eliminate the abdominal pain.  He has been burping a lot but has not noticed excessive flatulence.  Denies nausea, vomiting at this time. Concerned about the abdominal pain given his father and other family member died of pancreatic cancer. Also complains of low back pain which she rates as a 2/10 and improved with the use of ibuprofen.  Pain does not radiate down his lower extremities and he denies lower extremity paresthesia, falls or loss of sphincteric function.  He denies heavy lifting. He admits to vaping.  Past Medical History:  Diagnosis Date  . Heart murmur   . Neck fracture (HCC)   . Seasonal allergic rhinitis     History reviewed. No pertinent surgical history.  Family History  Problem Relation Age of Onset  . Hypertension Mother   . Liver disease Father   . Pancreatic cancer Father     No Known Allergies  Outpatient Medications Prior to Visit  Medication Sig Dispense Refill  . acetaminophen (TYLENOL) 500 MG tablet Take 500-1,000 mg by mouth every 6 (six) hours as needed for pain.    . cephALEXin (KEFLEX) 500 MG capsule Take 1 capsule (500 mg total) by mouth 4 (four) times daily. (Patient not taking: Reported on 11/11/2018)  28 capsule 0  . cetirizine (ZYRTEC) 10 MG tablet Take 10 mg by mouth daily.    Marland Kitchen HYDROcodone-acetaminophen (NORCO/VICODIN) 5-325 MG per tablet Take 1 tablet by mouth every 4 (four) hours as needed for pain. (Patient not taking: Reported on 02/06/2014) 15 tablet 0  . ibuprofen (ADVIL,MOTRIN) 600 MG tablet Take 1 tablet (600 mg total) by mouth every 6 (six) hours as needed. (Patient not taking: Reported on 11/11/2018) 30 tablet 0  . methocarbamol (ROBAXIN) 500 MG tablet Take 1 tablet (500 mg total) by mouth 2 (two) times daily. (Patient not taking: Reported on 02/06/2014) 20 tablet 0  . ondansetron (ZOFRAN ODT) 4 MG disintegrating tablet Take 1 tablet (4 mg total) by mouth every 8 (eight) hours as needed for nausea or vomiting. (Patient not taking: Reported on 11/11/2018) 10 tablet 0  . ibuprofen (ADVIL,MOTRIN) 800 MG tablet Take 1 tablet (800 mg total) by mouth 3 (three) times daily. (Patient not taking: Reported on 11/11/2018) 15 tablet 0   No facility-administered medications prior to visit.      ROS Review of Systems  Constitutional: Negative for activity change and appetite change.  HENT: Negative for sinus pressure and sore throat.   Eyes: Negative for visual disturbance.  Respiratory: Negative for cough, chest tightness and shortness of breath.   Cardiovascular: Negative for chest pain and leg swelling.  Gastrointestinal: Positive for constipation. Negative for abdominal distention, abdominal pain and diarrhea.  Endocrine: Negative.   Genitourinary: Negative for dysuria.  Musculoskeletal: Positive for back pain. Negative for joint swelling and myalgias.  Skin: Negative for rash.  Allergic/Immunologic: Negative.   Neurological: Negative for weakness, light-headedness and numbness.  Psychiatric/Behavioral: Negative for dysphoric mood and suicidal ideas.    Objective:  BP 137/82   Pulse 66   Temp 98 F (36.7 C) (Oral)   Ht 5\' 11"  (1.803 m)   Wt 199 lb (90.3 kg)   SpO2 100%   BMI  27.75 kg/m   BP/Weight 11/11/2018 04/13/252 03/01/621  Systolic BP 762 831 517  Diastolic BP 82 90 76  Wt. (Lbs) 199 - -  BMI 27.75 - -      Physical Exam Constitutional:      Appearance: He is well-developed.  Neck:     Vascular: No JVD.  Cardiovascular:     Rate and Rhythm: Normal rate.     Heart sounds: Normal heart sounds. No murmur.  Pulmonary:     Effort: Pulmonary effort is normal.     Breath sounds: Normal breath sounds. No wheezing or rales.  Chest:     Chest wall: No tenderness.  Abdominal:     General: Bowel sounds are normal. There is no distension.     Palpations: Abdomen is soft. There is no mass.     Tenderness: There is no abdominal tenderness.  Musculoskeletal: Normal range of motion.     Right lower leg: No edema.     Left lower leg: No edema.     Comments: No tenderness on palpation of entire spine Negative straight leg raise bilaterally  Neurological:     Mental Status: He is alert and oriented to person, place, and time.  Psychiatric:        Mood and Affect: Mood normal.     CMP Latest Ref Rng & Units 10/17/2018 03/03/2016 02/06/2014  Glucose 70 - 99 mg/dL 126(H) 102(H) 92  BUN 6 - 20 mg/dL 9 11 6   Creatinine 0.61 - 1.24 mg/dL 0.99 1.00 0.90  Sodium 135 - 145 mmol/L 137 137 139  Potassium 3.5 - 5.1 mmol/L 3.7 4.3 3.9  Chloride 98 - 111 mmol/L 103 105 102  CO2 22 - 32 mmol/L 24 24 -  Calcium 8.9 - 10.3 mg/dL 9.8 10.1 -  Total Protein 6.5 - 8.1 g/dL 7.1 - -  Total Bilirubin 0.3 - 1.2 mg/dL 0.3 - -  Alkaline Phos 38 - 126 U/L 81 - -  AST 15 - 41 U/L 17 - -  ALT 0 - 44 U/L 21 - -    Lipid Panel  No results found for: CHOL, TRIG, HDL, CHOLHDL, VLDL, LDLCALC, LDLDIRECT  CBC    Component Value Date/Time   WBC 10.2 10/17/2018 1300   RBC 6.10 (H) 10/17/2018 1300   HGB 14.5 10/17/2018 1300   HCT 44.2 10/17/2018 1300   PLT 391 10/17/2018 1300   MCV 72.5 (L) 10/17/2018 1300   MCH 23.8 (L) 10/17/2018 1300   MCHC 32.8 10/17/2018 1300   RDW 14.9  10/17/2018 1300    No results found for: HGBA1C  Assessment & Plan:   1. Generalized abdominal pain CT abdomen from ED visit reviewed and unremarkable Could be secondary to constipation We will need to exclude H. pylori gastritis Commence PPI - H. pylori breath test - omeprazole (PRILOSEC) 20 MG capsule; Take 1 capsule (20 mg total) by mouth daily.  Dispense: 30 capsule; Refill: 3  2. Chronic bilateral low back pain without sciatica Musculoskeletal Pain is minimal at this time  Advised to apply heat - ibuprofen (ADVIL) 600 MG tablet; Take 1 tablet (600 mg total) by mouth every 8 (eight) hours as needed.  Dispense: 60 tablet; Refill: 1  3. Other constipation We have discussed measures to reduce constipation including increasing fiber intake, water  4.  Tobacco abuse Spent 3 minutes counseling on quitting and he will be thinking about it Advised that this is a risk factor for several cancers and eliminating this will be highly beneficial.  Meds ordered this encounter  Medications  . omeprazole (PRILOSEC) 20 MG capsule    Sig: Take 1 capsule (20 mg total) by mouth daily.    Dispense:  30 capsule    Refill:  3    Follow-up: Return in about 3 months (around 02/11/2019) for medical conditions.       Hoy RegisterEnobong Leonore Frankson, MD, FAAFP. [facility] [place], [place] [phone]   11/11/2018, 10:11 AM

## 2018-11-11 NOTE — Progress Notes (Signed)
Patient is having abdominal pain due to constipation.

## 2018-11-11 NOTE — Patient Instructions (Signed)

## 2018-11-12 ENCOUNTER — Other Ambulatory Visit: Payer: Self-pay | Admitting: Family Medicine

## 2018-11-12 DIAGNOSIS — K2970 Gastritis, unspecified, without bleeding: Secondary | ICD-10-CM

## 2018-11-12 DIAGNOSIS — B9681 Helicobacter pylori [H. pylori] as the cause of diseases classified elsewhere: Secondary | ICD-10-CM

## 2018-11-12 LAB — H. PYLORI BREATH TEST: H pylori Breath Test: POSITIVE — AB

## 2018-11-12 MED ORDER — CLARITHROMYCIN 500 MG PO TABS: 500.0000 mg | ORAL_TABLET | Freq: Two times a day (BID) | ORAL | 0 refills | Status: AC

## 2018-11-12 MED ORDER — AMOXICILLIN 500 MG PO CAPS: 1000.0000 mg | ORAL_CAPSULE | Freq: Two times a day (BID) | ORAL | 0 refills | Status: AC

## 2018-11-15 ENCOUNTER — Telehealth: Payer: Self-pay

## 2018-11-15 NOTE — Telephone Encounter (Signed)
-----   Message from Charlott Rakes, MD sent at 11/12/2018  6:28 PM EDT ----- Labs reveal he is positive for Helicobacter pylori bacteria and I have sent a prescription for amoxicillin and clarithromycin which he will take in addition to the omeprazole for his symptoms.  Please schedule him for repeat test in 1 month to assess for eradication.

## 2018-11-15 NOTE — Telephone Encounter (Signed)
Patient was called and a voicemail was left informing patient to return phone call for lab results. 

## 2018-11-18 NOTE — Telephone Encounter (Signed)
Patient called back for his lab results. Please follow up

## 2018-11-19 NOTE — Telephone Encounter (Signed)
Patient was called and a voicemail was left informing patient to return phone call. 

## 2018-11-20 NOTE — Telephone Encounter (Signed)
Patient returned phone call and was informed of lab results and medication being sent to pharmacy.

## 2019-02-11 ENCOUNTER — Ambulatory Visit: Payer: BC Managed Care – PPO | Admitting: Family Medicine

## 2019-02-24 ENCOUNTER — Ambulatory Visit: Payer: BC Managed Care – PPO | Admitting: Family Medicine

## 2019-04-09 ENCOUNTER — Other Ambulatory Visit: Payer: Self-pay | Admitting: Family Medicine

## 2019-04-09 DIAGNOSIS — R1084 Generalized abdominal pain: Secondary | ICD-10-CM
